# Patient Record
Sex: Female | Born: 1950 | Race: Black or African American | Hispanic: No | Marital: Married | Smoking: Never smoker
Health system: Southern US, Community
[De-identification: ages and names within clinical notes are randomized; demographics above are authoritative.]

---

## 2018-05-22 ENCOUNTER — Ambulatory Visit (INDEPENDENT_AMBULATORY_CARE_PROVIDER_SITE_OTHER): Payer: Self-pay

## 2018-05-22 ENCOUNTER — Encounter (INDEPENDENT_AMBULATORY_CARE_PROVIDER_SITE_OTHER): Payer: Self-pay | Admitting: Orthopedic Surgery

## 2018-05-22 ENCOUNTER — Ambulatory Visit (INDEPENDENT_AMBULATORY_CARE_PROVIDER_SITE_OTHER): Payer: Medicare Other | Admitting: Orthopedic Surgery

## 2018-05-22 DIAGNOSIS — G8929 Other chronic pain: Secondary | ICD-10-CM | POA: Diagnosis not present

## 2018-05-22 DIAGNOSIS — M1712 Unilateral primary osteoarthritis, left knee: Secondary | ICD-10-CM | POA: Diagnosis not present

## 2018-05-22 DIAGNOSIS — M25562 Pain in left knee: Secondary | ICD-10-CM | POA: Diagnosis not present

## 2018-05-22 MED ORDER — METHYLPREDNISOLONE ACETATE 40 MG/ML IJ SUSP
40.0000 mg | INTRAMUSCULAR | Status: AC | PRN
  Administered 2018-05-22: 40 mg via INTRA_ARTICULAR

## 2018-05-22 MED ORDER — LIDOCAINE HCL 1 % IJ SOLN
5.0000 mL | INTRAMUSCULAR | Status: AC | PRN
  Administered 2018-05-22: 5 mL

## 2018-05-22 NOTE — Progress Notes (Signed)
Office Visit Note   Patient: Andrea Mcmahon           Date of Birth: 1951/02/10           MRN: [id number] Visit Date: 05/22/2018              Requested by: Medicine, [facility] [address] [place], [place] [address] PCP: Medicine, [facility]  Chief Complaint  Patient presents with  . Left Knee - Pain      HPI: Patient is a 67 year old woman who complains of swelling and pain in her left knee complains of global pain pain in the popliteal fossa states her knee gives way she has not falling she has start up stiffness unable to kneel she is tried a prednisone taper without relief.  Past medical history is updated review of systems negative.  Assessment & Plan: Visit Diagnoses:  1. Chronic pain of left knee   2. Unilateral primary osteoarthritis, left knee     Plan: The left knee was injected she tolerated this well she will use ice tonight follow-up in 4 weeks for reevaluation patient may benefit from hyaluronic acid injection.  Follow-Up Instructions: Return in about 4 weeks (around 06/19/2018).   Ortho Exam  Patient is alert, oriented, no adenopathy, well-dressed, normal affect, normal respiratory effort. Examination patient has difficulty getting from sitting to standing position she has some swelling in the popliteal fossa and some swelling in the suprapatellar pouch.  She is tender to palpation mainly over the medial joint line collaterals and cruciates are stable there is no mechanical catching with range of motion.  Imaging: Xr Knee 1-2 Views Left  Result Date: 05/22/2018 2 view radiographs of the left knee shows medial joint space narrowing with subchondral sclerosis.  There are mild periarticular bony spurs.  No images are attached to the encounter.  Labs: No results found for: HGBA1C, ESRSEDRATE, CRP, LABURIC, REPTSTATUS, GRAMSTAIN, CULT, LABORGA   No results found for: ALBUMIN, PREALBUMIN, LABURIC  There is no height or weight on file to calculate BMI.  Orders:   Orders Placed This Encounter  Procedures  . XR Knee 1-2 Views Left   No orders of the defined types were placed in this encounter.    Procedures: Large Joint Inj: L knee on 05/22/2018 9:56 AM Indications: pain and diagnostic evaluation Details: 22 G 1.5 in needle, anteromedial approach  Arthrogram: No  Medications: 5 mL lidocaine 1 %; 40 mg methylPREDNISolone acetate 40 MG/ML Outcome: tolerated well, no immediate complications Procedure, treatment alternatives, risks and benefits explained, specific risks discussed. Consent was given by the patient. Immediately prior to procedure a time out was called to verify the correct patient, procedure, equipment, support staff and site/side marked as required. Patient was prepped and draped in the usual sterile fashion.      Clinical Data: No additional findings.  ROS:  All other systems negative, except as noted in the HPI. Review of Systems  Objective: Vital Signs: There were no vitals taken for this visit.  Specialty Comments:  No specialty comments available.  PMFS History: There are no active problems to display for this patient.  History reviewed. No pertinent past medical history.  History reviewed. No pertinent family history.  History reviewed. No pertinent surgical history. Social History   Occupational History  . Not on file  Tobacco Use  . Smoking status: Not on file  Substance and Sexual Activity  . Alcohol use: Not on file  . Drug use: Not on file  .  Sexual activity: Not on file

## 2018-06-19 ENCOUNTER — Encounter (INDEPENDENT_AMBULATORY_CARE_PROVIDER_SITE_OTHER): Payer: Self-pay | Admitting: Orthopedic Surgery

## 2018-06-19 ENCOUNTER — Ambulatory Visit (INDEPENDENT_AMBULATORY_CARE_PROVIDER_SITE_OTHER): Payer: Medicare Other | Admitting: Physician Assistant

## 2018-06-19 VITALS — Ht 61.0 in | Wt 155.0 lb

## 2018-06-19 DIAGNOSIS — G8929 Other chronic pain: Secondary | ICD-10-CM

## 2018-06-19 DIAGNOSIS — M25562 Pain in left knee: Secondary | ICD-10-CM | POA: Diagnosis not present

## 2018-06-19 DIAGNOSIS — M1712 Unilateral primary osteoarthritis, left knee: Secondary | ICD-10-CM | POA: Diagnosis not present

## 2018-06-20 ENCOUNTER — Encounter (INDEPENDENT_AMBULATORY_CARE_PROVIDER_SITE_OTHER): Payer: Self-pay | Admitting: Physician Assistant

## 2018-06-20 NOTE — Progress Notes (Signed)
   Office Visit Note   Patient: Andrea Mcmahon           Date of Birth: 1951/07/02           MRN: [id number] Visit Date: 06/19/2018              Requested by: Medicine, [facility] [address] [place], [place] [address] PCP: Medicine, [facility]  Chief Complaint  Patient presents with  . Left Knee - Follow-up    Inj in left knee f/u      HPI: The patient is seen for follow up of her left knee pain. She underwent hyaluronic acid injection to the left knee joint about 1 month ago now. She reports her pain is much improved following the injection and she is very pleased with the result. She is concerned about how long her result may last and we discussed that it varies greatly from person to person. We discussed that she could have another injection after 6 months from her last injection if her symptoms recur.   Assessment & Plan: Visit Diagnoses:  1. Unilateral primary osteoarthritis, left knee   2. Chronic pain of left knee     Plan: Counseled patient she can gradually resume normal activities including going to the gym as tolerated. Discussed the importance of strengthening her thigh muscles to protect the knee joints and demonstrated to patient.   Follow up prn for questions or concerns, recurrent symptoms.   Follow-Up Instructions: Return if symptoms worsen or fail to improve.   Ortho Exam  Patient is alert, oriented, no adenopathy, well-dressed, normal affect, normal respiratory effort. Left knee range of motion 0-110 pain free range. No edema or effusion. Knee stable.   Imaging: No results found. No images are attached to the encounter.  Labs: No results found for: HGBA1C, ESRSEDRATE, CRP, LABURIC, REPTSTATUS, GRAMSTAIN, CULT, LABORGA   No results found for: ALBUMIN, PREALBUMIN, LABURIC  Body mass index is 29.29 kg/m.  Orders:  No orders of the defined types were placed in this encounter.  No orders of the defined types were placed in this encounter.    Procedures: No procedures performed  Clinical Data: No additional findings.  ROS:  All other systems negative, except as noted in the HPI. Review of Systems  Objective: Vital Signs: Ht 5\' 1"  (1.549 m)   Wt 155 lb (70.3 kg)   BMI 29.29 kg/m   Specialty Comments:  No specialty comments available.  PMFS History: There are no active problems to display for this patient.  History reviewed. No pertinent past medical history.  History reviewed. No pertinent family history.  History reviewed. No pertinent surgical history. Social History   Occupational History  . Not on file  Tobacco Use  . Smoking status: Never Smoker  . Smokeless tobacco: Never Used  Substance and Sexual Activity  . Alcohol use: Never    Frequency: Never  . Drug use: Never  . Sexual activity: Never

## 2021-05-22 ENCOUNTER — Other Ambulatory Visit: Payer: Self-pay

## 2021-05-22 ENCOUNTER — Other Ambulatory Visit: Payer: Self-pay | Admitting: Internal Medicine

## 2021-05-22 ENCOUNTER — Ambulatory Visit
Admission: RE | Admit: 2021-05-22 | Discharge: 2021-05-22 | Disposition: A | Discharge status: Home / Self Care | Payer: Medicare Other | Source: Ambulatory Visit | Attending: Internal Medicine | Admitting: Internal Medicine

## 2021-05-22 DIAGNOSIS — Z139 Encounter for screening, unspecified: Secondary | ICD-10-CM

## 2022-04-11 IMAGING — MG MM DIGITAL SCREENING BILAT W/ TOMO AND CAD
8 series · 8 of 24 positions shown · non-contrast
Comparison: Previous exam(s).

CLINICAL DATA: Screening.

EXAM:
DIGITAL SCREENING BILATERAL MAMMOGRAM WITH TOMOSYNTHESIS AND CAD
TECHNIQUE: Bilateral screening digital craniocaudal and mediolateral oblique
mammograms were obtained. Bilateral screening digital breast
tomosynthesis was performed. The images were evaluated with
computer-aided detection.

[L MLO synth-2D]
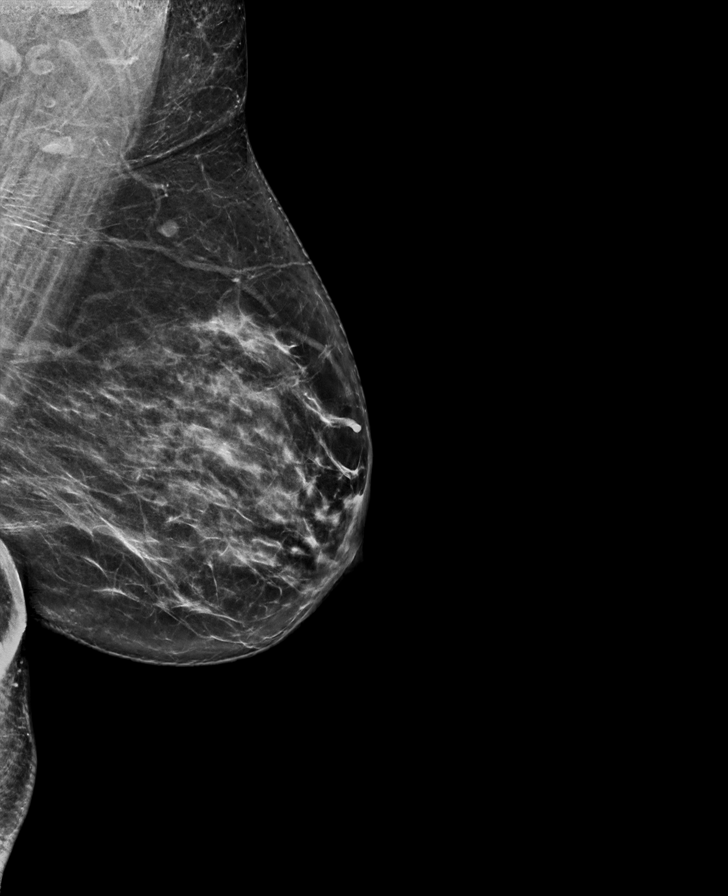

[R MLO synth-2D]
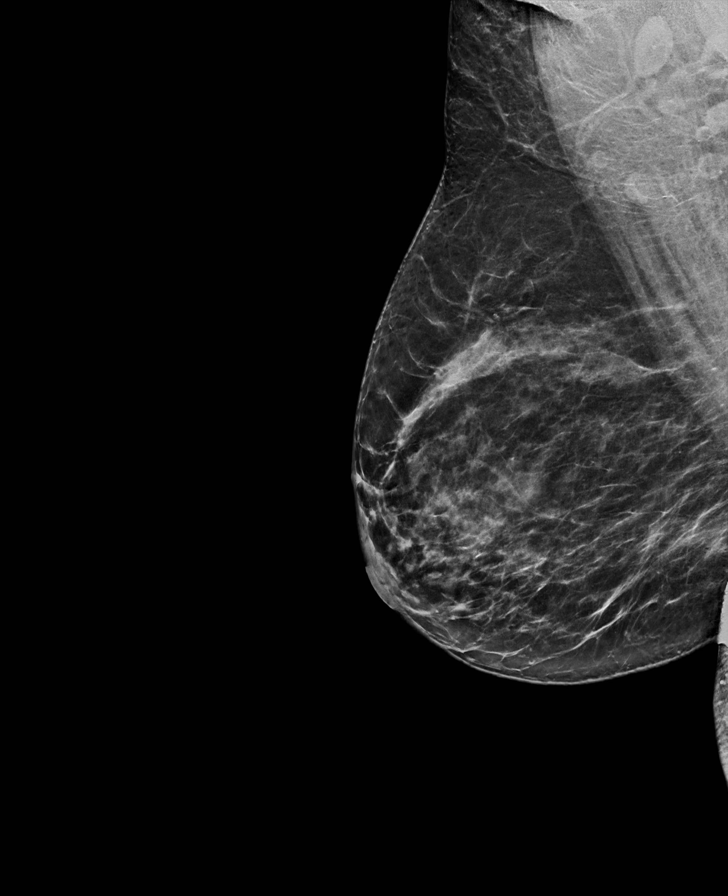

[L CC synth-2D]
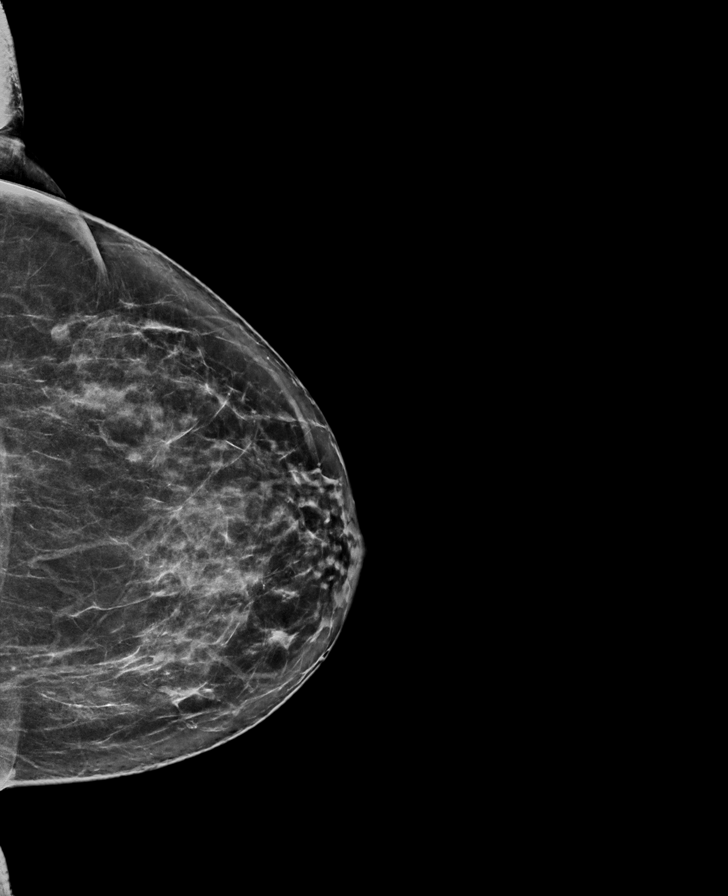

[R CC synth-2D]
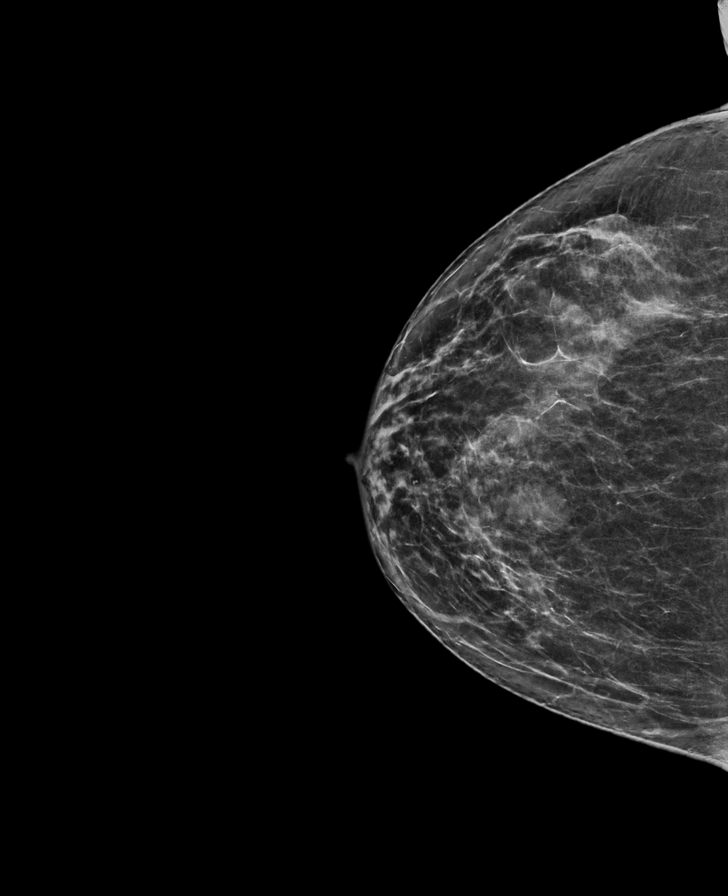

[L MLO tomo · tomo slice 39/76.0]
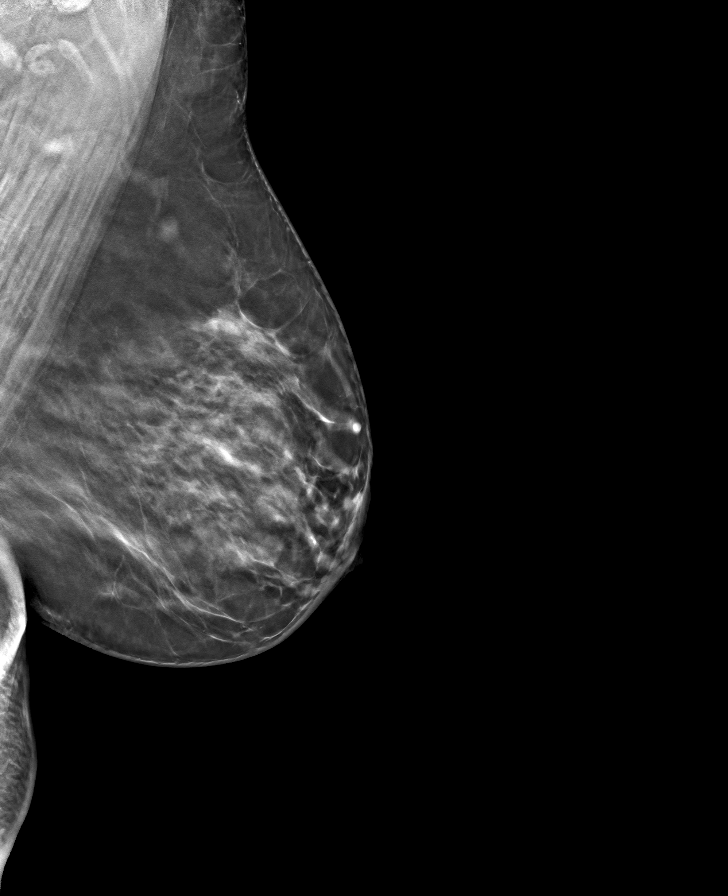

[R MLO tomo · tomo slice 38/75.0]
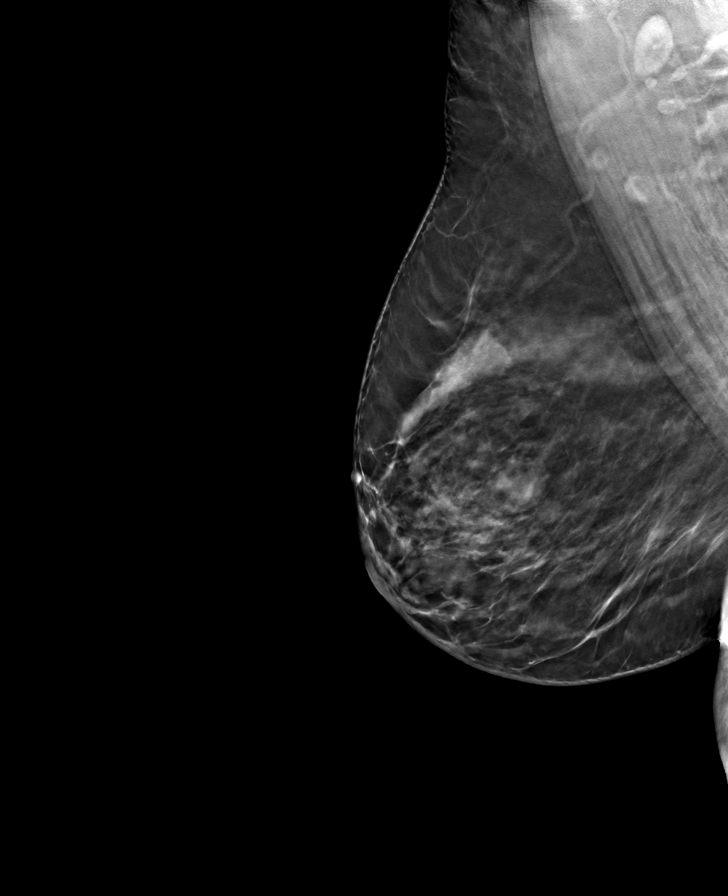

[L CC tomo · tomo slice 35/70.0]
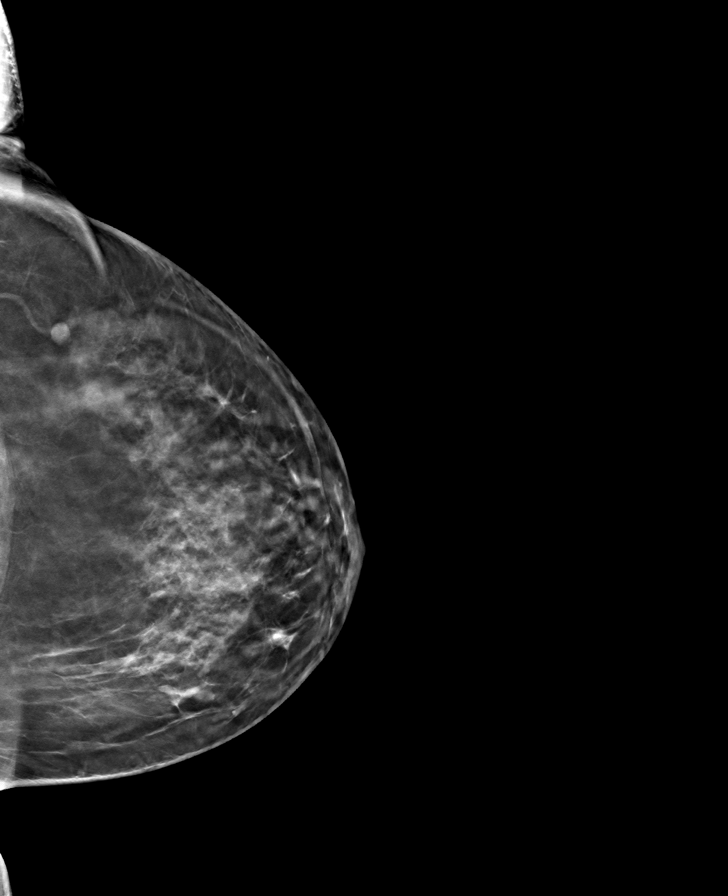

[R CC tomo · tomo slice 33/66.0]
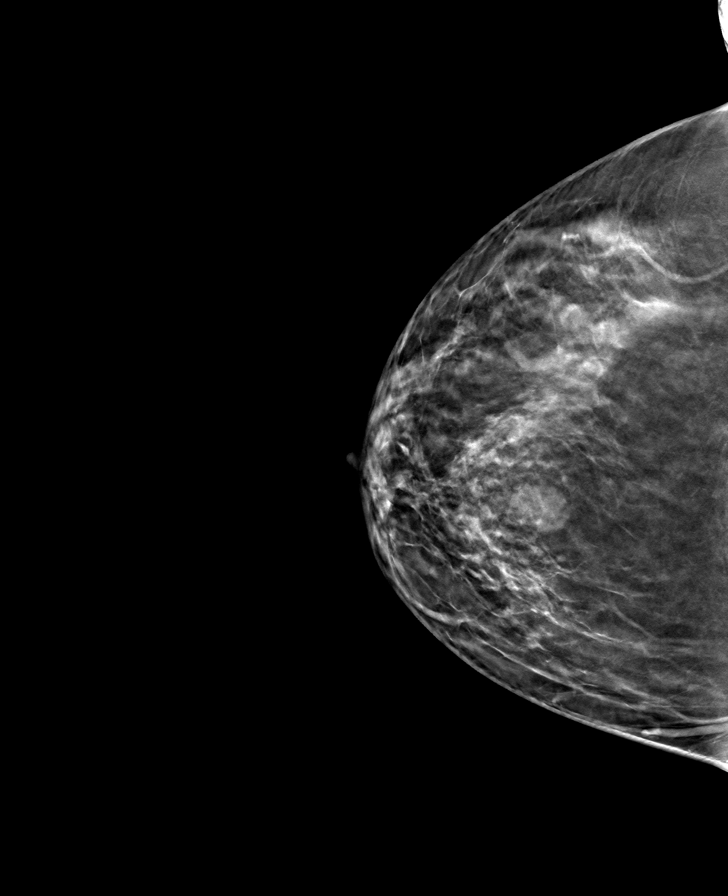

[8 of 24 positions shown; findings below may reference images not displayed]

ACR Breast Density Category c: The breast tissue is heterogeneously
dense, which may obscure small masses.
FINDINGS: There are no findings suspicious for malignancy.
IMPRESSION: No mammographic evidence of malignancy. A result letter of this
screening mammogram will be mailed directly to the patient.

RECOMMENDATION:
Screening mammogram in one year. (Code:Q3-W-BC3)

BI-RADS CATEGORY  1: Negative.

## 2023-01-15 ENCOUNTER — Other Ambulatory Visit: Payer: Self-pay

## 2023-01-15 DIAGNOSIS — Z1231 Encounter for screening mammogram for malignant neoplasm of breast: Secondary | ICD-10-CM

## 2023-01-29 ENCOUNTER — Inpatient Hospital Stay: Admission: RE | Admit: 2023-01-29 | Payer: Medicare Other | Source: Ambulatory Visit
